# Patient Record
Sex: Female | Born: 1995 | Race: White | Hispanic: No | Marital: Single | Smoking: Former smoker
Health system: Southern US, Community
[De-identification: ages and names within clinical notes are randomized; demographics above are authoritative.]

## PROBLEM LIST (undated history)

## (undated) DIAGNOSIS — R109 Unspecified abdominal pain: Secondary | ICD-10-CM

## (undated) DIAGNOSIS — K5900 Constipation, unspecified: Secondary | ICD-10-CM

## (undated) DIAGNOSIS — R110 Nausea: Secondary | ICD-10-CM

## (undated) HISTORY — DX: Constipation, unspecified: K59.00

## (undated) HISTORY — DX: Nausea: R11.0

## (undated) HISTORY — DX: Unspecified abdominal pain: R10.9

## (undated) HISTORY — PX: TONSILLECTOMY: SUR1361

---
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
## 2014-01-14 ENCOUNTER — Telehealth: Payer: Self-pay | Admitting: Family Medicine

## 2014-01-14 NOTE — Telephone Encounter (Signed)
Appt given for tomorrow

## 2014-01-15 ENCOUNTER — Encounter: Payer: Self-pay | Admitting: Nurse Practitioner

## 2014-01-15 ENCOUNTER — Ambulatory Visit (INDEPENDENT_AMBULATORY_CARE_PROVIDER_SITE_OTHER): Payer: Medicaid Other | Admitting: Nurse Practitioner

## 2014-01-15 VITALS — BP 122/88 | HR 80 | Temp 98.7°F | Ht <= 58 in | Wt 90.0 lb

## 2014-01-15 DIAGNOSIS — N3000 Acute cystitis without hematuria: Secondary | ICD-10-CM

## 2014-01-15 DIAGNOSIS — N760 Acute vaginitis: Secondary | ICD-10-CM

## 2014-01-15 DIAGNOSIS — N898 Other specified noninflammatory disorders of vagina: Secondary | ICD-10-CM

## 2014-01-15 DIAGNOSIS — B9689 Other specified bacterial agents as the cause of diseases classified elsewhere: Secondary | ICD-10-CM

## 2014-01-15 DIAGNOSIS — Z3009 Encounter for other general counseling and advice on contraception: Secondary | ICD-10-CM

## 2014-01-15 DIAGNOSIS — A499 Bacterial infection, unspecified: Secondary | ICD-10-CM

## 2014-01-15 LAB — POCT URINALYSIS DIPSTICK
BILIRUBIN UA: NEGATIVE
Glucose, UA: NEGATIVE
Ketones, UA: NEGATIVE
NITRITE UA: NEGATIVE
PH UA: 7
Spec Grav, UA: 1.015
UROBILINOGEN UA: NEGATIVE

## 2014-01-15 LAB — POCT WET PREP (WET MOUNT)

## 2014-01-15 LAB — POCT UA - MICROSCOPIC ONLY
CASTS, UR, LPF, POC: NEGATIVE
CRYSTALS, UR, HPF, POC: NEGATIVE
MUCUS UA: NEGATIVE
YEAST UA: NEGATIVE

## 2014-01-15 MED ORDER — METRONIDAZOLE 500 MG PO TABS: 500.0000 mg | ORAL_TABLET | Freq: Two times a day (BID) | ORAL | Status: DC

## 2014-01-15 MED ORDER — NITROFURANTOIN MONOHYD MACRO 100 MG PO CAPS: 100.0000 mg | ORAL_CAPSULE | Freq: Two times a day (BID) | ORAL | Status: DC

## 2014-01-15 MED ORDER — LEVONORGESTREL-ETHINYL ESTRAD 0.1-20 MG-MCG PO TABS: 1.0000 | ORAL_TABLET | Freq: Every day | ORAL | Status: AC

## 2014-01-15 NOTE — Patient Instructions (Signed)
Safe Sex Safe sex is about reducing the risk of giving or getting a sexually transmitted disease (STD). STDs are spread through sexual contact involving the genitals, mouth, or rectum. Some STDs can be cured and others cannot. Safe sex can also prevent unintended pregnancies.  WHAT ARE SOME SAFE SEX PRACTICES?  Limit your sexual activity to only one partner who is only having sex with you.  Talk to your partner about his or her past partners, past STDs, and drug use.  Use a condom every time you have sexual intercourse. This includes vaginal, oral, and anal sexual activity. Both females and males should wear condoms during oral sex. Only use latex or polyurethane condoms and water-based lubricants. Using petroleum-based lubricants or oils to lubricate a condom will weaken the condom and increase the chance that it will break. The condom should be in place from the beginning to the end of sexual activity. Wearing a condom reduces, but does not completely eliminate, your risk of getting or giving an STD. STDs can be spread by contact with infected body fluids and skin.  Get vaccinated for hepatitis B and HPV.  Avoid alcohol and recreational drugs which can affect your judgement. You may forget to use a condom or participate in high-risk sex.  For females, avoid douching after sexual intercourse. Douching can spread an infection farther into the reproductive tract.  Check your body for signs of sores, blisters, rashes, or unusual discharge. See your health care provider if you notice any of these signs.  Avoid sexual contact if you have symptoms of an infection or are being treated for an STD. If you or your partner has herpes, avoid sexual contact when blisters are present. Use condoms at all other times.  If you are at risk of being infected with HIV, it is recommended that you take a prescription medicine daily to prevent HIV infection. This is called pre-exposure prophylaxis (PrEP). You are  considered at risk if:  You are a man who has sex with other men (MSM).  You are a heterosexual man or woman who is sexually active with more than one partner.  You take drugs by injection.  You are sexually active with a partner who has HIV.  Talk with your health care provider about whether you are at high risk of being infected with HIV. If you choose to begin PrEP, you should first be tested for HIV. You should then be tested every 3 months for as long as you are taking PrEP.  See your health care provider for regular screenings, exams, and tests for other STDs. Before having sex with a new partner, each of you should be screened for STDs and should talk about the results with each other. WHAT ARE THE BENEFITS OF SAFE SEX?   There is less chance of getting or giving an STD.  You can prevent unwanted or unintended pregnancies.  By discussing safe sex concerns with your partner, you may increase feelings of intimacy, comfort, trust, and honesty between the two of you. Document Released: 08/05/2004 Document Revised: 07/03/2013 Document Reviewed: 12/20/2011 Exit[employer] Patient Information 2015 [employer]. This information is not intended to replace advice given to you by your health care provider. Make sure you discuss any questions you have with your health care provider.  

## 2014-01-15 NOTE — Progress Notes (Signed)
Subjective:    Patient ID: Andrea Clayton, female    DOB: 1996-05-12, 18 y.o.   MRN: [id number]  HPI Patient in c/o painful intercourse- SHe just recently became sexually active again- the last several times it has been painful on her left side. SHe also says that she has a discharge that is white- denies any itching or dysuria. SLight pain when she wipes after voiding.    Review of Systems  Constitutional: Negative.   HENT: Negative.   Respiratory: Negative.   Cardiovascular: Negative.   Genitourinary: Positive for pelvic pain (only with intercourse.). Negative for dysuria, urgency and frequency.  Neurological: Negative.   Psychiatric/Behavioral: Negative.   All other systems reviewed and are negative.      Objective:   Physical Exam  Constitutional: She is oriented to person, place, and time. She appears well-developed and well-nourished.  Cardiovascular: Normal rate, regular rhythm and normal heart sounds.   Pulmonary/Chest: Effort normal and breath sounds normal.  Abdominal: Soft. Bowel sounds are normal. There is tenderness (left upper quadrant).  Neurological: She is alert and oriented to person, place, and time.  Skin: Skin is warm and dry.  Psychiatric: She has a normal mood and affect. Her behavior is normal. Judgment and thought content normal.   BP 122/88  Pulse 80  Temp(Src) 98.7 F (37.1 C) (Oral)  Ht 4\' 8"  (1.422 m)  Wt 90 lb (40.824 kg)  BMI 20.19 kg/m2  LMP 12/23/2013  Results for orders placed in visit on 01/15/14  POCT UA - MICROSCOPIC ONLY      Result Value Ref Range   WBC, Ur, HPF, POC 5-10     RBC, urine, microscopic 5-10     Bacteria, U Microscopic occ     Mucus, UA negative     Epithelial cells, urine per micros occ     Crystals, Ur, HPF, POC negative     Casts, Ur, LPF, POC negative     Yeast, UA negative    POCT URINALYSIS DIPSTICK      Result Value Ref Range   Color, UA gold     Clarity, UA slightly cloudy     Glucose, UA  negative     Bilirubin, UA negative     Ketones, UA negative     Spec Grav, UA 1.015     Blood, UA moderate     pH, UA 7.0     Protein, UA 4+     Urobilinogen, UA negative     Nitrite, UA negative     Leukocytes, UA large (3+)    POCT WET PREP (WET MOUNT)      Result Value Ref Range   Source Wet Prep POC vaginal     WBC, Wet Prep HPF POC 10-15     Bacteria Wet Prep HPF POC moderate     Clue Cells Wet Prep HPF POC Moderate     Yeast Wet Prep HPF POC None         Assessment & Plan:   1. Vaginal discharge   2. Bacterial vaginosis   3. Acute cystitis without hematuria   4. Birth control counseling    Meds ordered this encounter  Medications  . levonorgestrel-ethinyl estradiol (AVIANE) 0.1-20 MG-MCG tablet    Sig: Take 1 tablet by mouth daily.    Dispense:  1 Package    Refill:  11    Order Specific Question:  Supervising Provider    Answer:  Ernestina PennaMOORE, DONALD W [[id number]]  .  nitrofurantoin, macrocrystal-monohydrate, (MACROBID) 100 MG capsule    Sig: Take 1 capsule (100 mg total) by mouth 2 (two) times daily.    Dispense:  14 capsule    Refill:  0    Order Specific Question:  Supervising Provider    Answer:  Ernestina PennaMOORE, DONALD W [[id number]]  . metroNIDAZOLE (FLAGYL) 500 MG tablet    Sig: Take 1 tablet (500 mg total) by mouth 2 (two) times daily.    Dispense:  14 tablet    Refill:  0    Order Specific Question:  Supervising Provider    Answer:  Deborra MedinaMOORE, DONALD W [[id number]]   Safe sex Discussed how to take birth control Void after intercourse RTO if no better  Mary-Margaret Daphine DeutscherMartin, FNP

## 2014-01-18 ENCOUNTER — Emergency Department (HOSPITAL_COMMUNITY)
Admission: EM | Admit: 2014-01-18 | Discharge: 2014-01-18 | Discharge status: Against Medical Advice | Payer: Medicaid Other | Attending: Emergency Medicine | Admitting: Emergency Medicine

## 2014-01-18 ENCOUNTER — Encounter (HOSPITAL_COMMUNITY): Payer: Self-pay | Admitting: Emergency Medicine

## 2014-01-18 DIAGNOSIS — R109 Unspecified abdominal pain: Secondary | ICD-10-CM | POA: Diagnosis not present

## 2014-01-18 LAB — URINALYSIS, ROUTINE W REFLEX MICROSCOPIC
Bilirubin Urine: NEGATIVE
GLUCOSE, UA: NEGATIVE mg/dL
Hgb urine dipstick: NEGATIVE
KETONES UR: NEGATIVE mg/dL
NITRITE: NEGATIVE
PH: 7 (ref 5.0–8.0)
Protein, ur: NEGATIVE mg/dL
Specific Gravity, Urine: 1.015 (ref 1.005–1.030)
Urobilinogen, UA: 0.2 mg/dL (ref 0.0–1.0)

## 2014-01-18 LAB — URINE MICROSCOPIC-ADD ON

## 2014-01-18 LAB — PREGNANCY, URINE: PREG TEST UR: NEGATIVE

## 2014-01-18 NOTE — ED Notes (Addendum)
[facility] once again [facility].

## 2014-01-18 NOTE — ED Notes (Signed)
[facility].

## 2014-01-18 NOTE — ED Notes (Signed)
Pt has decided that she does not want to be seen due to the prolonged wait time. Pt and her friend was advised if she left she would still be billed and we were doing our very best to get her back.

## 2014-01-18 NOTE — ED Notes (Signed)
Pt. reports mid abdominal pain with nausea with nausea onset this evening , currently taking antibiotic for UTI .

## 2014-03-19 ENCOUNTER — Encounter: Payer: Self-pay | Admitting: Nurse Practitioner

## 2014-03-19 ENCOUNTER — Telehealth: Payer: Self-pay | Admitting: Nurse Practitioner

## 2014-03-19 ENCOUNTER — Ambulatory Visit (INDEPENDENT_AMBULATORY_CARE_PROVIDER_SITE_OTHER): Payer: Medicaid Other | Admitting: Nurse Practitioner

## 2014-03-19 VITALS — BP 117/80 | HR 118 | Temp 102.1°F | Ht <= 58 in | Wt 84.2 lb

## 2014-03-19 DIAGNOSIS — R5381 Other malaise: Secondary | ICD-10-CM

## 2014-03-19 DIAGNOSIS — R5383 Other fatigue: Secondary | ICD-10-CM

## 2014-03-19 DIAGNOSIS — Z00129 Encounter for routine child health examination without abnormal findings: Secondary | ICD-10-CM

## 2014-03-19 DIAGNOSIS — N3000 Acute cystitis without hematuria: Secondary | ICD-10-CM

## 2014-03-19 DIAGNOSIS — Z0000 Encounter for general adult medical examination without abnormal findings: Secondary | ICD-10-CM

## 2014-03-19 DIAGNOSIS — R634 Abnormal weight loss: Secondary | ICD-10-CM

## 2014-03-19 DIAGNOSIS — R509 Fever, unspecified: Secondary | ICD-10-CM

## 2014-03-19 DIAGNOSIS — N390 Urinary tract infection, site not specified: Secondary | ICD-10-CM

## 2014-03-19 LAB — POCT CBC
GRANULOCYTE PERCENT: 79.1 % (ref 37–80)
HEMATOCRIT: 42.1 % (ref 37.7–47.9)
HEMOGLOBIN: 13.1 g/dL (ref 12.2–16.2)
Lymph, poc: 1.3 (ref 0.6–3.4)
MCH: 27.5 pg (ref 27–31.2)
MCHC: 31.2 g/dL — AB (ref 31.8–35.4)
MCV: 88.4 fL (ref 80–97)
MPV: 7.8 fL (ref 0–99.8)
POC Granulocyte: 5.9 (ref 2–6.9)
POC LYMPH PERCENT: 17.9 %L (ref 10–50)
Platelet Count, POC: 220 10*3/uL (ref 142–424)
RBC: 4.8 M/uL (ref 4.04–5.48)
RDW, POC: 13 %
WBC: 7.5 10*3/uL (ref 4.6–10.2)

## 2014-03-19 LAB — POCT UA - MICROSCOPIC ONLY
CASTS, UR, LPF, POC: NEGATIVE
CRYSTALS, UR, HPF, POC: NEGATIVE
MUCUS UA: NEGATIVE
YEAST UA: NEGATIVE

## 2014-03-19 LAB — POCT URINALYSIS DIPSTICK
BILIRUBIN UA: NEGATIVE
Glucose, UA: NEGATIVE
KETONES UA: NEGATIVE
NITRITE UA: POSITIVE
PH UA: 6
Spec Grav, UA: 1.025
Urobilinogen, UA: NEGATIVE

## 2014-03-19 LAB — POCT URINE PREGNANCY: Preg Test, Ur: NEGATIVE

## 2014-03-19 MED ORDER — DOXYCYCLINE HYCLATE 100 MG PO TABS: 100.0000 mg | ORAL_TABLET | Freq: Two times a day (BID) | ORAL | Status: DC

## 2014-03-19 MED ORDER — NITROFURANTOIN MONOHYD MACRO 100 MG PO CAPS: 100.0000 mg | ORAL_CAPSULE | Freq: Two times a day (BID) | ORAL | Status: DC

## 2014-03-19 NOTE — Patient Instructions (Signed)

## 2014-03-19 NOTE — Progress Notes (Signed)
  Subjective:    Patient ID: Andrea Clayton, female    DOB: April 07, 1996, 18 y.o.   MRN: [id number]  HPI Patient in today for CPE- She has no medical problems and is on no meds- states that she is currently sexually active.  Currently having menstration ( very heavy) that is regular lasting 1 week.  Patient woke up with a sore throat, headache, and feels very tired. Temp 102.1 and patient says thart she feels fine.  States her breast are very tender for over a week. * headaches at 2-3 x a week- does not take any meds- denies caffeine over consumption * Hot at night with chills for over a week * fatigue  Review of Systems  Respiratory: Negative.   Cardiovascular: Negative.   Skin: Negative.   All other systems reviewed and are negative.      Objective:   Physical Exam  Constitutional: She is oriented to person, place, and time. She appears well-developed and well-nourished.  HENT:  Right Ear: External ear normal.  Left Ear: External ear normal.  Nose: Nose normal.  Mouth/Throat: Oropharynx is clear and moist.  Eyes: EOM are normal.  Neck: Trachea normal, normal range of motion and full passive range of motion without pain. Neck supple. No JVD present. Carotid bruit is not present. No thyromegaly present.  Cardiovascular: Normal rate, regular rhythm, normal heart sounds and intact distal pulses.  Exam reveals no gallop and no friction rub.   No murmur heard. Pulmonary/Chest: Effort normal and breath sounds normal.  Abdominal: Soft. Bowel sounds are normal. She exhibits no distension and no mass. There is no tenderness.  Genitourinary:  No pelvic exam today  Musculoskeletal: Normal range of motion.  Lymphadenopathy:    She has no cervical adenopathy.  Neurological: She is alert and oriented to person, place, and time. She has normal reflexes.  Skin: Skin is warm and dry.  Psychiatric: She has a normal mood and affect. Her behavior is normal. Judgment and thought content  normal.    BP 117/80  Pulse 118  Temp(Src) 102.1 F (38.9 C) (Oral)  Ht 4' 9.5" (1.461 m)  Wt 84 lb 3.2 oz (38.193 kg)  BMI 17.89 kg/m2  LMP 03/19/2014        Assessment & Plan:   1. Annual physical exam   2. Other malaise and fatigue   3. Fever, unspecified   4. Loss of weight   5. Acute cystitis without hematuria     Meds ordered this encounter  Medications  . DISCONTD: nitrofurantoin, macrocrystal-monohydrate, (MACROBID) 100 MG capsule    Sig: Take 1 capsule (100 mg total) by mouth 2 (two) times daily.    Dispense:  14 capsule    Refill:  0    Order Specific Question:  Supervising Provider    Answer:  Ernestina Penna [[id number]]  . doxycycline (VIBRA-TABS) 100 MG tablet    Sig: Take 1 tablet (100 mg total) by mouth 2 (two) times daily.    Dispense:  20 tablet    Refill:  0    Order Specific Question:  Supervising Provider    Answer:  Ernestina Penna [[id number]]     Force fluids  rest RTO prn  Mary-Margaret Daphine Deutscher, [place]

## 2014-03-19 NOTE — Addendum Note (Signed)
Addended by: Orma Render F on: 03/19/2014 01:35 PM   Modules accepted: Orders

## 2014-03-20 NOTE — Telephone Encounter (Signed)
Have patient come in for flu swab

## 2014-03-20 NOTE — Telephone Encounter (Signed)
Patient thinks she might have the flu because her fever spiked up to 103 last night

## 2014-03-20 NOTE — Telephone Encounter (Signed)
Yes we have flus in

## 2014-03-20 NOTE — Telephone Encounter (Signed)
Is she achy- check to see if we have flu swabs yet

## 2014-03-20 NOTE — Telephone Encounter (Signed)
lmtcb

## 2014-03-21 ENCOUNTER — Ambulatory Visit (INDEPENDENT_AMBULATORY_CARE_PROVIDER_SITE_OTHER): Payer: Medicaid Other | Admitting: Nurse Practitioner

## 2014-03-21 ENCOUNTER — Telehealth: Payer: Self-pay | Admitting: Nurse Practitioner

## 2014-03-21 ENCOUNTER — Encounter: Payer: Self-pay | Admitting: Nurse Practitioner

## 2014-03-21 VITALS — BP 112/88 | HR 82 | Temp 100.0°F | Ht <= 58 in | Wt 84.0 lb

## 2014-03-21 DIAGNOSIS — R509 Fever, unspecified: Secondary | ICD-10-CM

## 2014-03-21 LAB — BMP8+EGFR
BUN/Creatinine Ratio: 16 (ref 8–20)
BUN: 9 mg/dL (ref 6–20)
CALCIUM: 8.8 mg/dL (ref 8.7–10.2)
CHLORIDE: 98 mmol/L (ref 97–108)
CO2: 22 mmol/L (ref 18–29)
Creatinine, Ser: 0.56 mg/dL — ABNORMAL LOW (ref 0.57–1.00)
GFR calc non Af Amer: 136 mL/min/{1.73_m2} (ref >59)
GFR, EST AFRICAN AMERICAN: 156 mL/min/{1.73_m2} (ref >59)
GLUCOSE: 89 mg/dL (ref 65–99)
POTASSIUM: 4.5 mmol/L (ref 3.5–5.2)
Sodium: 137 mmol/L (ref 134–144)

## 2014-03-21 LAB — POCT INFLUENZA A/B
INFLUENZA A, POC: NEGATIVE
INFLUENZA B, POC: NEGATIVE

## 2014-03-21 LAB — LYME AB/WESTERN BLOT REFLEX
LYME DISEASE AB, QUANT, IGM: <0.8 index (ref 0.00–0.79)
Lyme IgG/IgM Ab: <0.91 {ISR} (ref 0.00–0.90)

## 2014-03-21 LAB — URINE CULTURE

## 2014-03-21 LAB — EPSTEIN-BARR VIRUS VCA ANTIBODY PANEL
EBV AB VCA, IGG: 106 U/mL — AB (ref 0.0–17.9)
EBV AB VCA, IGM: <36 U/mL (ref 0.0–35.9)
EBV NUCLEAR ANTIGEN AB, IGG: 194 U/mL — AB (ref 0.0–17.9)

## 2014-03-21 LAB — ROCKY MTN SPOTTED FVR ABS PNL(IGG+IGM)
RMSF IGM: 0.36 {index} (ref 0.00–0.89)
RMSF IgG: NEGATIVE

## 2014-03-21 NOTE — Telephone Encounter (Signed)
Left message for patient to come to office for flu swap.

## 2014-03-21 NOTE — Progress Notes (Signed)
   Subjective:    Patient ID: Andrea Clayton, female    DOB: 08/23/1995, 18 y.o.   MRN: [id number]  HPI Patient in not feeloing any better- SHe still has a fever of over 102 and feels terrible- She has not picked upp doxycycline rx that was sent in on 03/19/14.    Review of Systems  Constitutional: Positive for fever and appetite change (decreased).  HENT: Negative.   Eyes: Negative.   Respiratory: Negative.   Cardiovascular: Negative.   Genitourinary: Negative.   Neurological: Negative.   Psychiatric/Behavioral: Negative.   All other systems reviewed and are negative.      Objective:   Physical Exam  Constitutional: She is oriented to person, place, and time. She appears well-developed and well-nourished. She appears distressed.  HENT:  Right Ear: External ear normal.  Left Ear: External ear normal.  Nose: Nose normal.  Mouth/Throat: Oropharynx is clear and moist.  Eyes: Pupils are equal, round, and reactive to light.  Neck: Neck supple.  Cardiovascular: Normal rate, regular rhythm and normal heart sounds.   Pulmonary/Chest: Effort normal and breath sounds normal.  Abdominal: Soft.  Neurological: She is alert and oriented to person, place, and time.  Skin: Skin is warm and dry.  Psychiatric: She has a normal mood and affect. Her behavior is normal. Judgment and thought content normal.   BP 112/88  Pulse 82  Temp(Src) 100 F (37.8 C) (Oral)  Ht 4' 9.5" (1.461 m)  Wt 84 lb (38.102 kg)  BMI 17.85 kg/m2  LMP 03/19/2014       Assessment & Plan:   1. Fever, unspecified    Pick up doxy at pharmacy Force fluids  Rest RTO prn  Mary-Margaret Daphine Deutscher, FNP

## 2014-03-21 NOTE — Patient Instructions (Signed)

## 2014-03-21 NOTE — Telephone Encounter (Signed)
Pt aware only one script sent in to cover infection.  Macrobid was cancelled.

## 2014-03-25 ENCOUNTER — Encounter: Payer: Self-pay | Admitting: *Deleted

## 2014-03-26 ENCOUNTER — Telehealth: Payer: Self-pay | Admitting: Nurse Practitioner

## 2014-03-26 NOTE — Telephone Encounter (Signed)
Patient aware of results.

## 2014-11-26 ENCOUNTER — Telehealth: Payer: Self-pay | Admitting: Nurse Practitioner

## 2014-11-26 DIAGNOSIS — Z800 Family history of malignant neoplasm of digestive organs: Secondary | ICD-10-CM

## 2014-11-26 NOTE — Telephone Encounter (Signed)
Referral made 

## 2014-11-26 NOTE — Telephone Encounter (Signed)
Patient has a history of colon cancer. She said mother had colon cancer at the age of 228. She states that she is having abdominal pain, feels very tired and fatigued.

## 2014-11-26 NOTE — Telephone Encounter (Signed)
lmovm that referral has been made

## 2014-11-30 ENCOUNTER — Emergency Department (HOSPITAL_BASED_OUTPATIENT_CLINIC_OR_DEPARTMENT_OTHER)
Admission: EM | Admit: 2014-11-30 | Discharge: 2014-11-30 | Disposition: A | Discharge status: Home / Self Care | Payer: Medicaid Other | Attending: Emergency Medicine | Admitting: Emergency Medicine

## 2014-11-30 ENCOUNTER — Encounter (HOSPITAL_BASED_OUTPATIENT_CLINIC_OR_DEPARTMENT_OTHER): Payer: Self-pay

## 2014-11-30 ENCOUNTER — Emergency Department (HOSPITAL_BASED_OUTPATIENT_CLINIC_OR_DEPARTMENT_OTHER): Payer: Medicaid Other

## 2014-11-30 DIAGNOSIS — Z720 Tobacco use: Secondary | ICD-10-CM | POA: Diagnosis not present

## 2014-11-30 DIAGNOSIS — Z792 Long term (current) use of antibiotics: Secondary | ICD-10-CM | POA: Diagnosis not present

## 2014-11-30 DIAGNOSIS — R52 Pain, unspecified: Secondary | ICD-10-CM

## 2014-11-30 DIAGNOSIS — Z3202 Encounter for pregnancy test, result negative: Secondary | ICD-10-CM | POA: Diagnosis not present

## 2014-11-30 DIAGNOSIS — Z79899 Other long term (current) drug therapy: Secondary | ICD-10-CM | POA: Diagnosis not present

## 2014-11-30 DIAGNOSIS — Z8719 Personal history of other diseases of the digestive system: Secondary | ICD-10-CM | POA: Diagnosis not present

## 2014-11-30 DIAGNOSIS — R1032 Left lower quadrant pain: Secondary | ICD-10-CM | POA: Insufficient documentation

## 2014-11-30 DIAGNOSIS — R102 Pelvic and perineal pain: Secondary | ICD-10-CM | POA: Diagnosis present

## 2014-11-30 LAB — PREGNANCY, URINE: Preg Test, Ur: NEGATIVE

## 2014-11-30 LAB — URINE MICROSCOPIC-ADD ON

## 2014-11-30 LAB — URINALYSIS, ROUTINE W REFLEX MICROSCOPIC
Bilirubin Urine: NEGATIVE
Glucose, UA: NEGATIVE mg/dL
HGB URINE DIPSTICK: NEGATIVE
KETONES UR: NEGATIVE mg/dL
NITRITE: NEGATIVE
Protein, ur: NEGATIVE mg/dL
Specific Gravity, Urine: 1.021 (ref 1.005–1.030)
UROBILINOGEN UA: 1 mg/dL (ref 0.0–1.0)
pH: 6.5 (ref 5.0–8.0)

## 2014-11-30 LAB — WET PREP, GENITAL: TRICH WET PREP: NONE SEEN

## 2014-11-30 NOTE — ED Notes (Addendum)
Pt with left/suprapubic pelvic pain.  Reports whitish vaginal discharge.  Sexually active.  Tearful, states "i don't know if my ovarian cyst popped or what".  When asked if she has hx pt states no testing for same but mother informed her that she may have them b/c they run in family.  Denies n/v/d or dysuria. Of note, pt presents to ED with large cup of soda and subway sandwich in hand.

## 2014-11-30 NOTE — ED Provider Notes (Signed)
CSN: [id number]     Arrival date & time 11/30/14  1735 History  This chart was scribed for Arby Barrette, MD by Doreatha Martin, ED Scribe. This patient was seen in room MH06/MH06 and the patient's care was started at 6:30 PM.     Chief Complaint  Patient presents with  . Pelvic Pain   The history is provided by the patient. No language interpreter was used.   HPI Comments: Andrea Clayton is a 19 y.o. female sexually active with no PMHx of ovarian cysts who presents to the Emergency Department complaining of sudden onset, moderate, sharp abdominal pain localized to the LLQ and suprapubic area onset 20 min PTA. She states that the pain caused her to drop to the ground but has not taken any OTC medications for pain PTA. Pt states she has had similar episdoes of left sided abdominal pain before lasting 60-90 min and has been to multiple Gastroenterologists for the issue throughout her life with no specified Dx. She denies cough, congestion, nausea, rash, joint swelling, vomiting.Pt denies associated pain radiating to the lower back. Pt is allergic to Bc powder and Aspirin. She denies oral contraceptive use or being seen by an OB/GYN. She states her last sexual activity was 3 weeks ago. Pt reports vaginal discharge but states that this is consistent with baseline. Pt was recently seen with similar symptoms in [place] 2 months ago per her report.  Past Medical History  Diagnosis Date  . Abdominal pain   . Nausea   . Constipation    Past Surgical History  Procedure Laterality Date  . Tonsillectomy     Family History  Problem Relation Age of Onset  . Cholelithiasis Maternal Grandmother   . Celiac disease Neg Hx    History  Substance Use Topics  . Smoking status: Light Tobacco Smoker  . Smokeless tobacco: Not on file  . Alcohol Use: No   OB History    No data available     Review of Systems A complete 10 system review of systems was obtained and all systems are negative except  as noted in the HPI and PMH.     Allergies  Aspirin and Bc powder  Home Medications   Prior to Admission medications   Medication Sig Start Date End Date Taking? Authorizing Provider  doxycycline (VIBRA-TABS) 100 MG tablet Take 1 tablet (100 mg total) by mouth 2 (two) times daily. 03/19/14   Mary-Margaret Daphine Deutscher, FNP  levonorgestrel-ethinyl estradiol (AVIANE) 0.1-20 MG-MCG tablet Take 1 tablet by mouth daily. 01/15/14   Mary-Margaret Daphine Deutscher, FNP   Triage VS: BP 113/65 mmHg  Pulse 108  Temp(Src) 98.2 F (36.8 C) (Oral)  Resp 21  Ht  (1.422 m)  Wt 82 lb (37.195 kg)  BMI 18.39 kg/m2  SpO2 100%  LMP 11/10/2014 Physical Exam  Constitutional: She is oriented to person, place, and time. She appears well-developed and well-nourished.  HENT:  Head: Normocephalic and atraumatic.  Eyes: EOM are normal. Pupils are equal, round, and reactive to light.  Neck: Neck supple.  Cardiovascular: Normal rate, regular rhythm, normal heart sounds and intact distal pulses.   Pulmonary/Chest: Effort normal and breath sounds normal.  Abdominal: Soft. Bowel sounds are normal. She exhibits no distension. There is tenderness.  Moderate left lower quadrant tenderness to palpation. No guarding or rebound. Remainder of abdomen is soft and nontender without mass.  Genitourinary:  Normal external female genitalia. Speculum examination shows normal-appearing cervix without friability or discharge. Bimanual examination patient endorses  some tenderness in the left adnexa. There is no appreciable mass present.  Musculoskeletal: Normal range of motion. She exhibits no edema.  Neurological: She is alert and oriented to person, place, and time. She has normal strength. Coordination normal. GCS eye subscore is 4. GCS verbal subscore is 5. GCS motor subscore is 6.  Skin: Skin is warm, dry and intact.  Psychiatric: She has a normal mood and affect.    ED Course  Procedures  DIAGNOSTIC STUDIES: Oxygen Saturation is  100% on RA, normal by my interpretation.    COORDINATION OF CARE: 6:46 PM Discussed treatment plan with pt at bedside which includes Ibuprofen and pelvis US and pt agreed to plan.   Labs Review Labs Reviewed  URINALYSIS, ROUTINE W REFLEX MICROSCOPIC - Abnormal; Notable for the following:    APPearance CLOUDY (*)    Leukocytes, UA SMALL (*)    All other components within normal limits  URINE MICROSCOPIC-ADD ON - Abnormal; Notable for the following:    Squamous Epithelial / LPF MANY (*)    Bacteria, UA MANY (*)    All other components within normal limits  WET PREP, GENITAL  PREGNANCY, URINE  GC/CHLAMYDIA PROBE AMP ([place])    Imaging Review [place] Transvaginal Non-ob  11/30/2014   CLINICAL DATA:  Sudden left lower quadrant pain x2 hr  EXAM: TRANSABDOMINAL AND TRANSVAGINAL ULTRASOUND OF PELVIS  DOPPLER ULTRASOUND OF OVARIES  TECHNIQUE: Both transabdominal and transvaginal ultrasound examinations of the pelvis were performed. Transabdominal technique was performed for global imaging of the pelvis including uterus, ovaries, adnexal regions, and pelvic cul-de-sac.  It was necessary to proceed with endovaginal exam following the transabdominal exam to visualize the uterus and bilateral ovaries. Color and duplex Doppler ultrasound was utilized to evaluate blood flow to the ovaries.  COMPARISON:  None.  FINDINGS: Uterus  Measurements: 7.6 x 3.6 x 4.5 cm. No fibroids or other mass visualized.  Endometrium  Thickness: 8 mm.  No focal abnormality visualized.  Right ovary  Measurements: 3.6 x 2.1 x 2.1 cm. Normal appearance/no adnexal mass.  Left ovary  Measurements: 4.1 x 1.7 x 2.2 cm. Normal appearance/no adnexal mass.  Pulsed Doppler evaluation of both ovaries demonstrates normal low-resistance arterial and venous waveforms.  Other findings  Small volume pelvic ascites.  IMPRESSION: Negative pelvic ultrasound.  No evidence of ovarian torsion.   Electronically Signed   By: Charline BillsSriyesh  Krishnan M.D.   On:  11/30/2014 19:26   [place] Pelvis Complete  11/30/2014   CLINICAL DATA:  Sudden left lower quadrant pain x2 hr  EXAM: TRANSABDOMINAL AND TRANSVAGINAL ULTRASOUND OF PELVIS  DOPPLER ULTRASOUND OF OVARIES  TECHNIQUE: Both transabdominal and transvaginal ultrasound examinations of the pelvis were performed. Transabdominal technique was performed for global imaging of the pelvis including uterus, ovaries, adnexal regions, and pelvic cul-de-sac.  It was necessary to proceed with endovaginal exam following the transabdominal exam to visualize the uterus and bilateral ovaries. Color and duplex Doppler ultrasound was utilized to evaluate blood flow to the ovaries.  COMPARISON:  None.  FINDINGS: Uterus  Measurements: 7.6 x 3.6 x 4.5 cm. No fibroids or other mass visualized.  Endometrium  Thickness: 8 mm.  No focal abnormality visualized.  Right ovary  Measurements: 3.6 x 2.1 x 2.1 cm. Normal appearance/no adnexal mass.  Left ovary  Measurements: 4.1 x 1.7 x 2.2 cm. Normal appearance/no adnexal mass.  Pulsed Doppler evaluation of both ovaries demonstrates normal low-resistance arterial and venous waveforms.  Other findings  Small volume pelvic ascites.  IMPRESSION: Negative pelvic ultrasound.  No evidence of ovarian torsion.   Electronically Signed   By: Charline Bills M.D.   On: 11/30/2014 19:26   [place] Art/ven Flow Abd Pelv Doppler  11/30/2014   CLINICAL DATA:  Sudden left lower quadrant pain x2 hr  EXAM: TRANSABDOMINAL AND TRANSVAGINAL ULTRASOUND OF PELVIS  DOPPLER ULTRASOUND OF OVARIES  TECHNIQUE: Both transabdominal and transvaginal ultrasound examinations of the pelvis were performed. Transabdominal technique was performed for global imaging of the pelvis including uterus, ovaries, adnexal regions, and pelvic cul-de-sac.  It was necessary to proceed with endovaginal exam following the transabdominal exam to visualize the uterus and bilateral ovaries. Color and duplex Doppler ultrasound was utilized to evaluate blood  flow to the ovaries.  COMPARISON:  None.  FINDINGS: Uterus  Measurements: 7.6 x 3.6 x 4.5 cm. No fibroids or other mass visualized.  Endometrium  Thickness: 8 mm.  No focal abnormality visualized.  Right ovary  Measurements: 3.6 x 2.1 x 2.1 cm. Normal appearance/no adnexal mass.  Left ovary  Measurements: 4.1 x 1.7 x 2.2 cm. Normal appearance/no adnexal mass.  Pulsed Doppler evaluation of both ovaries demonstrates normal low-resistance arterial and venous waveforms.  Other findings  Small volume pelvic ascites.  IMPRESSION: Negative pelvic ultrasound.  No evidence of ovarian torsion.   Electronically Signed   By: Charline Bills M.D.   On: 11/30/2014 19:26     EKG Interpretation None     MDM   Final diagnoses:  Pelvic pain in female   Patient describes recurrent issues of left pelvic pain. At this time the pain is spontaneously abating. Is no clear etiology. Ultrasound does not show cyst or portion. Pregnancy test negative. Pelvic examination is not suggestive of PID. At this time the patient will be discharged to follow-up with gynecology. She does not endorse other incremental gastro enterologic symptoms.  Arby Barrette, MD 11/30/14 2106

## 2014-11-30 NOTE — Discharge Instructions (Signed)
Abdominal Pain, Women °Abdominal (stomach, pelvic, or belly) pain can be caused by many things. It is important to tell your doctor: °· The location of the pain. °· Does it come and go or is it present all the time? °· Are there things that start the pain (eating certain foods, exercise)? °· Are there other symptoms associated with the pain (fever, nausea, vomiting, diarrhea)? °All of this is helpful to know when trying to find the cause of the pain. °CAUSES  °· Stomach: virus or bacteria infection, or ulcer. °· Intestine: appendicitis (inflamed appendix), regional ileitis (Crohn's disease), ulcerative colitis (inflamed colon), irritable bowel syndrome, diverticulitis (inflamed diverticulum of the colon), or cancer of the stomach or intestine. °· Gallbladder disease or stones in the gallbladder. °· Kidney disease, kidney stones, or infection. °· Pancreas infection or cancer. °· Fibromyalgia (pain disorder). °· Diseases of the female organs: °¨ Uterus: fibroid (non-cancerous) tumors or infection. °¨ Fallopian tubes: infection or tubal pregnancy. °¨ Ovary: cysts or tumors. °¨ Pelvic adhesions (scar tissue). °¨ Endometriosis (uterus lining tissue growing in the pelvis and on the pelvic organs). °¨ Pelvic congestion syndrome (female organs filling up with blood just before the menstrual period). °¨ Pain with the menstrual period. °¨ Pain with ovulation (producing an egg). °¨ Pain with an IUD (intrauterine device, birth control) in the uterus. °¨ Cancer of the female organs. °· Functional pain (pain not caused by a disease, may improve without treatment). °· Psychological pain. °· Depression. °DIAGNOSIS  °Your doctor will decide the seriousness of your pain by doing an examination. °· Blood tests. °· X-rays. °· Ultrasound. °· CT scan (computed tomography, special type of X-ray). °· MRI (magnetic resonance imaging). °· Cultures, for infection. °· Barium enema (dye inserted in the large intestine, to better view it with  X-rays). °· Colonoscopy (looking in intestine with a lighted tube). °· Laparoscopy (minor surgery, looking in abdomen with a lighted tube). °· Major abdominal exploratory surgery (looking in abdomen with a large incision). °TREATMENT  °The treatment will depend on the cause of the pain.  °· Many cases can be observed and treated at home. °· Over-the-counter medicines recommended by your caregiver. °· Prescription medicine. °· Antibiotics, for infection. °· Birth control pills, for painful periods or for ovulation pain. °· Hormone treatment, for endometriosis. °· Nerve blocking injections. °· Physical therapy. °· Antidepressants. °· Counseling with a psychologist or psychiatrist. °· Minor or major surgery. °HOME CARE INSTRUCTIONS  °· Do not take laxatives, unless directed by your caregiver. °· Take over-the-counter pain medicine only if ordered by your caregiver. Do not take aspirin because it can cause an upset stomach or bleeding. °· Try a clear liquid diet (broth or water) as ordered by your caregiver. Slowly move to a bland diet, as tolerated, if the pain is related to the stomach or intestine. °· Have a thermometer and take your temperature several times a day, and record it. °· Bed rest and sleep, if it helps the pain. °· Avoid sexual intercourse, if it causes pain. °· Avoid stressful situations. °· Keep your follow-up appointments and tests, as your caregiver orders. °· If the pain does not go away with medicine or surgery, you may try: °¨ Acupuncture. °¨ Relaxation exercises (yoga, meditation). °¨ Group therapy. °¨ Counseling. °SEEK MEDICAL CARE IF:  °· You notice certain foods cause stomach pain. °· Your home care treatment is not helping your pain. °· You need stronger pain medicine. °· You want your IUD removed. °· You feel faint or   lightheaded. °· You develop nausea and vomiting. °· You develop a rash. °· You are having side effects or an allergy to your medicine. °SEEK IMMEDIATE MEDICAL CARE IF:  °· Your  pain does not go away or gets worse. °· You have a fever. °· Your pain is felt only in portions of the abdomen. The right side could possibly be appendicitis. The left lower portion of the abdomen could be colitis or diverticulitis. °· You are passing blood in your stools (bright red or black tarry stools, with or without vomiting). °· You have blood in your urine. °· You develop chills, with or without a fever. °· You pass out. °MAKE SURE YOU:  °· Understand these instructions. °· Will watch your condition. °· Will get help right away if you are not doing well or get worse. °Document Released: 04/25/2007 Document Revised: 11/12/2013 Document Reviewed: 05/15/2009 °ExitCare® Patient Information ©2015 [employer]. This information is not intended to replace advice given to you by your health care provider. Make sure you discuss any questions you have with your health care provider. ° °Emergency Department Resource Guide °1) Find a Doctor and Pay Out of Pocket °Although you won't have to find out who is covered by your insurance plan, it is a good idea to ask around and get recommendations. You will then need to call the office and see if the doctor you have chosen will accept you as a new patient and what types of options they offer for patients who are self-pay. Some doctors offer discounts or will set up payment plans for their patients who do not have insurance, but you will need to ask so you aren't surprised when you get to your appointment. ° °2) Contact Your Local Health Department °Not all health departments have doctors that can see patients for sick visits, but many do, so it is worth a call to see if yours does. If you don't know where your local health department is, you can check in your phone book. The CDC also has a tool to help you locate your state's health department, and many state websites also have listings of all of their local health departments. ° °3) Find a Walk-in Clinic °If your illness is  not likely to be very severe or complicated, you may want to try a walk in clinic. These are popping up all over the country in pharmacies, drugstores, and shopping centers. They're usually staffed by nurse practitioners or physician assistants that have been trained to treat common illnesses and complaints. They're usually fairly quick and inexpensive. However, if you have serious medical issues or chronic medical problems, these are probably not your best option. ° °No Primary Care Doctor: °- Call [facility] at  832-8000 - they can help you locate a primary care doctor that  accepts your insurance, provides certain services, etc. °- Physician Referral Service- 1-800-533-3463 ° °Chronic Pain Problems: °Organization         Address  Phone   Notes  ° [facility]  (336) 297-2271 Patients need to be referred by their primary care doctor.  ° °Medication Assistance: °Organization         Address  Phone   Notes  °[place] [facility] [address] °[place],  [address] (336) 641-8030 --Must be a resident of [place] °-- Must have NO insurance coverage whatsoever (no Medicaid/ Medicare, etc.) °-- The pt. MUST have a primary care doctor that directs their care regularly   and follows them in the community °  °[facility]  (866) 331-1348   °[employer]  (888) 892-1162   ° °Agencies that provide inexpensive medical care: °Organization         Address  Phone   Notes  °[facility]  (336) 832-8035   °[facility]    (336) 832-7272   °[facility] [address] °[place], [place] [address] (336) 832-4777   °[facility] [address], °[place] (336) 271-4999   °[facility]    (336) 373-0678   °[facility]    (336) 272-1050   °[facility] ° [address], [place] Phone:  (336) 832-4444, Fax:  (336) 832-4440 Hours of Operation:  9 am - 6  pm, M-F.  Also accepts Medicaid/Medicare and self-pay.  °[facility] ° [address], [place] Phone: (336) 832-3150, Fax: (336) [phone]. Hours of Operation:  8:30 am - 5:30 pm, M-F.  Also accepts Medicaid and self-pay.  °[facility] [place] [address], [place] Phone: (336) 878-6027   °[facility] [address], [place], [place] (336)723-1848, Ext. 123 Mondays & Thursdays: 7-9 AM.  First 15 patients are seen on a first come, first serve basis. °  ° °Medicaid-accepting [place] Providers: ° °Organization         Address  Phone   Notes  °[facility] [address], [place] (336) 641-2100 Also accepts self-pay patients.  °[facility] [address], [place] ° (336) 856-9996   °[facility] [address], [place] (336) 288-8857   °[facility] [address] [place] [address], [place] (336) 299-7000   °Veita Bland [address], [place]  ° (336) 373-1557 Only accepts [place] Access Medicaid patients after they have their name applied to their card.  ° °Self-Pay (no insurance) in [place]: ° °Organization         Address  Phone   Notes  °Sickle Cell Patients, [facility] [address], [place] (336) 832-1970   °[facility] [address], [place] (336) 832-4400   °[facility] ° [address], [place] (336) 992-4800   °[facility]/Dr. Osei-Bonsu ° [address] [place] [address], [place] or [address], [place] (336) [phone] Phone number for both [place] and [place] locations is the same.  °[facility] [address], [place] (336) 299-0000   °[facility] [address] [place] [address], [place] or [address] (336) 852-7530 °(336) 878-2260   °[facility] [address], [place] (336) 350-1642, phone; (336) 294-5005, fax Sees patients 1st and 3rd Saturday of every month.  Must not qualify for public or private insurance (i.e. Medicaid, Medicare, [place] Health Choice, Veterans' Benefits) • Household income should be no more than 200% of the poverty level •The clinic cannot treat you if you are pregnant or think you are pregnant • Sexually transmitted diseases are not treated at the clinic.  ° ° °Dental Care: °Organization         Address  Phone  Notes  °[facility] [address], [place] (336) 641-6152 Accepts children up to age 21 who   are enrolled in Medicaid or [place] Health Choice; pregnant women with a Medicaid card; and children who have applied for Medicaid or [place] Health Choice, but were declined, whose parents can pay a reduced fee at time of service.  °[facility] [place]  [address], [place] (336) 641-7733 Accepts children up to age 21 who are enrolled in Medicaid or [place] Health Choice; pregnant women with a Medicaid card; and children who have applied for Medicaid or [place] Health Choice, but were declined, whose parents can pay a reduced fee at time of service.  °[facility] ° [address], [place] (336) 641-4533 Patients are seen by appointment only. Walk-ins are not accepted. [facility] will see patients 18 years of age and older. °Monday - Tuesday (8am-5pm) °Most Wednesdays (8:30-5pm) °$30 per visit, cash only  °[facility] ° [address], [place] (336) 641-4533 Patients are seen by appointment only. Walk-ins are not accepted. [facility] will see patients 18 years of age and older. °One Wednesday Evening (Monthly: Volunteer Based).  $30 per visit, cash only  °[facility]  (919) 537-3737 for adults; Children under age 4, call Graduate Pediatric Dentistry at ([phone]  [phone]. Children aged 4-14, please call ([phone]) [phone] to request a pediatric application. ° Dental services are provided in all areas of dental care including fillings, crowns and bridges, complete and partial dentures, implants, gum treatment, root canals, and extractions. Preventive care is also provided. Treatment is provided to both adults and children. °Patients are selected via a lottery and there is often a waiting list. °  °[facility] [address], °[place] ° (336) 763-8833 [email or web address] °  °[facility] [address], [place], [place] (336)723-1848, Ext. 123 Second and Fourth Thursday of each month, opens at 6:30 AM; Clinic ends at 9 AM.  Patients are seen on a first-come first-served basis, and a limited number are seen during each clinic.  ° °[facility] ° [address], [place], [place] (336) 723-7904   Eligibility Requirements °You must have lived in [place], [place], or [place] counties for at least the last three months. °  You cannot be eligible for state or federal sponsored healthcare insurance, including Veterans Administration, Medicaid, or Medicare. °  You generally cannot be eligible for healthcare insurance through your employer.  °  How to apply: °Eligibility screenings are held every Tuesday and Wednesday afternoon from 1:00 pm until 4:00 pm. You do not need an appointment for the interview!  °[facility] [address], [place], [place] 336-631-2330   °[facility]  336-342-8273   °[facility]  336-703-3100   °[facility]  336-570-6415   ° °Behavioral Health Resources in the Community: °Intensive Outpatient Programs °Organization         Address  Phone  Notes  °[facility] [address], [place], [place] 336-878-6098   °[facility] [address], [place], [place] 336-832-9800   °[facility]  [address], [place], [place] ° 336-882-2125   °[facility] [address],  °[place],  1-800-853-5163 or 336-641-4981   °Substance Abuse Resources °Organization         Address  Phone  Notes  °[facility]  336-882-2125   °[facility]  336-784-9470   °  [facility]  336-285-9073   °[facility]  336-845-3988   °Residential & Outpatient Substance Abuse Program  1-800-659-3381   °Psychological Services °Organization         Address  Phone  Notes  °[facility]  336- 832-9600   °[facility]  336- 378-7881   °[facility] [address], [place] 1-800-853-5163 or 336-641-4981   ° °Mobile Crisis Teams °Organization         Address  Phone  Notes  °[facility], Mobile Crisis Care Unit  1-877-626-1772   °Assertive °[facility] ° [address] [place], [place] 336-834-9664   °Sharon DeEsch [address] °[place] 336-554-5454   ° °Self-Help/Support Groups °Organization         Address  Phone             Notes  °[facility] - variety of support groups  336- 373-1402 Call for more information  °Narcotics Anonymous (NA), [facility] [address], °[place]  2 meetings at this location  ° °Residential Treatment Programs °Organization         Address  Phone  Notes  °[facility] [address],    °[place]  1-866-801-8205   °[facility] ° [address], [place], [place] 704-293-8524   °[facility] [address], [place] 336-845-3988 Admissions: 8am-3pm M-F  °[facility] [address]    °[place], [place] 336-841-1104   °[facility] [address], [place], [place] 336-379-7146   °[facility] [address]  °[place], [place] 336-285-9073   °[facility] - Intensive Outpatient [address], [place], [place]  336-852-3033   °[facility] [address]  °[place], [place] 1-877-615-2722 or 336-784-9470   °[facility] [address] [place], [place] 336-227-7417 Accepts Medicaid  °[facility] [address]  ° [place] 1-800-659-3381 Substance Abuse/Addiction Treatment  ° °[place] Behavioral Health Resources °Organization         Address  Phone  Notes  °[facility]  (888) 581-9988   °Julie Brannon, PhD [address] [place], [place]   (336) 349-5553 or (336) 951-0000   °[facility]   [address] °[place], [place] (336) 349-4454   °[facility] [address], [place], [place] (336) 342-8316 Insurance/Medicaid/sponsorship through [facility]  °[facility] [address]                                    [place], [place] (336) 342-8316 Therapy/tele-psych/case  °[facility] [address]  ° [place], [place] (336) 349-2233    °Dr. Arfeen  (336) 349-4544   °[facility] [place]  [employer] [place] [facility] 1) [address], [place] °2) [address], [place] °3)  [address], [place] (336) 349-3220 °(336) 342-7768 ° °(336) 342-8140   °[place] [facility] (336) 342-1394 or (336) 342-3537 (After Hours)    ° ° °

## 2014-12-02 ENCOUNTER — Telehealth: Payer: Self-pay

## 2014-12-02 NOTE — Telephone Encounter (Signed)
Rec'd from [facility] forward 4 pages to GI Historical Provider

## 2014-12-03 LAB — GC/CHLAMYDIA PROBE AMP (~~LOC~~) NOT AT ARMC
Chlamydia: NEGATIVE
Neisseria Gonorrhea: NEGATIVE

## 2014-12-23 ENCOUNTER — Ambulatory Visit (HOSPITAL_COMMUNITY)
Admission: RE | Admit: 2014-12-23 | Discharge: 2014-12-23 | Disposition: A | Discharge status: Home / Self Care | Payer: Medicaid Other | Source: Ambulatory Visit | Attending: Adult Health Nurse Practitioner | Admitting: Adult Health Nurse Practitioner

## 2014-12-23 ENCOUNTER — Other Ambulatory Visit (HOSPITAL_COMMUNITY): Payer: Self-pay | Admitting: *Deleted

## 2014-12-23 ENCOUNTER — Ambulatory Visit (HOSPITAL_COMMUNITY): Payer: Medicaid Other

## 2014-12-23 ENCOUNTER — Other Ambulatory Visit (HOSPITAL_COMMUNITY): Payer: Self-pay | Admitting: Adult Health Nurse Practitioner

## 2014-12-23 DIAGNOSIS — K5900 Constipation, unspecified: Secondary | ICD-10-CM

## 2015-03-23 ENCOUNTER — Emergency Department (HOSPITAL_COMMUNITY)
Admission: EM | Admit: 2015-03-23 | Discharge: 2015-03-23 | Disposition: A | Discharge status: Home / Self Care | Payer: Medicaid Other | Attending: Emergency Medicine | Admitting: Emergency Medicine

## 2015-03-23 ENCOUNTER — Encounter (HOSPITAL_COMMUNITY): Payer: Self-pay | Admitting: *Deleted

## 2015-03-23 DIAGNOSIS — N3001 Acute cystitis with hematuria: Secondary | ICD-10-CM | POA: Insufficient documentation

## 2015-03-23 DIAGNOSIS — N898 Other specified noninflammatory disorders of vagina: Secondary | ICD-10-CM | POA: Diagnosis present

## 2015-03-23 DIAGNOSIS — Z8719 Personal history of other diseases of the digestive system: Secondary | ICD-10-CM | POA: Insufficient documentation

## 2015-03-23 DIAGNOSIS — B9689 Other specified bacterial agents as the cause of diseases classified elsewhere: Secondary | ICD-10-CM

## 2015-03-23 DIAGNOSIS — N760 Acute vaginitis: Secondary | ICD-10-CM | POA: Insufficient documentation

## 2015-03-23 DIAGNOSIS — R000 Tachycardia, unspecified: Secondary | ICD-10-CM | POA: Insufficient documentation

## 2015-03-23 DIAGNOSIS — Z793 Long term (current) use of hormonal contraceptives: Secondary | ICD-10-CM | POA: Diagnosis not present

## 2015-03-23 DIAGNOSIS — Z720 Tobacco use: Secondary | ICD-10-CM | POA: Insufficient documentation

## 2015-03-23 DIAGNOSIS — Z3202 Encounter for pregnancy test, result negative: Secondary | ICD-10-CM | POA: Insufficient documentation

## 2015-03-23 DIAGNOSIS — R102 Pelvic and perineal pain: Secondary | ICD-10-CM

## 2015-03-23 LAB — CBC
HCT: 35.8 % — ABNORMAL LOW (ref 36.0–46.0)
Hemoglobin: 12.3 g/dL (ref 12.0–15.0)
MCH: 31.5 pg (ref 26.0–34.0)
MCHC: 34.4 g/dL (ref 30.0–36.0)
MCV: 91.6 fL (ref 78.0–100.0)
PLATELETS: 243 10*3/uL (ref 150–400)
RBC: 3.91 MIL/uL (ref 3.87–5.11)
RDW: 12.6 % (ref 11.5–15.5)
WBC: 7 10*3/uL (ref 4.0–10.5)

## 2015-03-23 LAB — WET PREP, GENITAL
Trich, Wet Prep: NONE SEEN
Yeast Wet Prep HPF POC: NONE SEEN

## 2015-03-23 LAB — COMPREHENSIVE METABOLIC PANEL
ALT: 16 U/L (ref 14–54)
AST: 22 U/L (ref 15–41)
Albumin: 3.5 g/dL (ref 3.5–5.0)
Alkaline Phosphatase: 58 U/L (ref 38–126)
Anion gap: 11 (ref 5–15)
BUN: 9 mg/dL (ref 6–20)
CHLORIDE: 101 mmol/L (ref 101–111)
CO2: 27 mmol/L (ref 22–32)
Calcium: 8.9 mg/dL (ref 8.9–10.3)
Creatinine, Ser: 0.58 mg/dL (ref 0.44–1.00)
GFR calc Af Amer: >60 mL/min (ref >60)
Glucose, Bld: 101 mg/dL — ABNORMAL HIGH (ref 65–99)
Potassium: 3.4 mmol/L — ABNORMAL LOW (ref 3.5–5.1)
Sodium: 139 mmol/L (ref 135–145)
Total Bilirubin: 0.7 mg/dL (ref 0.3–1.2)
Total Protein: 7 g/dL (ref 6.5–8.1)

## 2015-03-23 LAB — URINALYSIS, ROUTINE W REFLEX MICROSCOPIC
GLUCOSE, UA: NEGATIVE mg/dL
Hgb urine dipstick: NEGATIVE
KETONES UR: NEGATIVE mg/dL
Nitrite: NEGATIVE
PH: 7.5 (ref 5.0–8.0)
Protein, ur: NEGATIVE mg/dL
Specific Gravity, Urine: 1.023 (ref 1.005–1.030)
Urobilinogen, UA: 4 mg/dL — ABNORMAL HIGH (ref 0.0–1.0)

## 2015-03-23 LAB — POC URINE PREG, ED: Preg Test, Ur: NEGATIVE

## 2015-03-23 LAB — URINE MICROSCOPIC-ADD ON

## 2015-03-23 LAB — LIPASE, BLOOD: LIPASE: 19 U/L — AB (ref 22–51)

## 2015-03-23 MED ORDER — AZITHROMYCIN 250 MG PO TABS
1000.0000 mg | ORAL_TABLET | Freq: Once | ORAL | Status: AC
  Administered 2015-03-23: 1000 mg via ORAL
  Filled 2015-03-23: qty 4

## 2015-03-23 MED ORDER — METRONIDAZOLE 500 MG PO TABS: 500.0000 mg | ORAL_TABLET | Freq: Two times a day (BID) | ORAL | Status: DC

## 2015-03-23 MED ORDER — PHENAZOPYRIDINE HCL 200 MG PO TABS: 200.0000 mg | ORAL_TABLET | Freq: Three times a day (TID) | ORAL | Status: AC

## 2015-03-23 MED ORDER — ONDANSETRON HCL 4 MG PO TABS: 4.0000 mg | ORAL_TABLET | Freq: Four times a day (QID) | ORAL | Status: AC

## 2015-03-23 MED ORDER — CEFTRIAXONE SODIUM 1 G IJ SOLR
1.0000 g | Freq: Once | INTRAMUSCULAR | Status: AC
  Administered 2015-03-23: 1 g via INTRAVENOUS
  Filled 2015-03-23: qty 10

## 2015-03-23 NOTE — ED Provider Notes (Signed)
CSN: [id number]     Arrival date & time 03/23/15  0029 History   First MD Initiated Contact with Patient 03/23/15 0103     Chief Complaint  Patient presents with  . Hematuria     (Consider location/radiation/quality/duration/timing/severity/associated sxs/prior Treatment) Patient is a 19 y.o. female presenting with vaginal discharge and hematuria.  Vaginal Discharge Quality:  White and yellow Severity:  Moderate Onset quality:  Gradual Duration:  1 week Timing:  Constant Progression:  Worsening Chronicity:  New Relieved by:  None tried Worsened by:  Nothing tried Ineffective treatments:  None tried Associated symptoms: abdominal pain, fever and urinary frequency   Hematuria This is a new problem. The current episode started in the past 7 days. The problem occurs constantly. The problem has been unchanged. Associated symptoms include abdominal pain, chills, a fever and myalgias. Nothing aggravates the symptoms. She has tried nothing for the symptoms.   Andrea Clayton is a 19 y.o. female who presents to the ED with vaginal discharge and blood in her urine. She reports that she first started with symptoms one week ago. She had fever 4 nights ago and has been taking medication for fever. No fever since last night. She reports pain in the suprapubic area associated with urination. LMP 03/27/2015, no birth control. G1P0, no hx of STI's.   Past Medical History  Diagnosis Date  . Abdominal pain   . Nausea   . Constipation    Past Surgical History  Procedure Laterality Date  . Tonsillectomy     Family History  Problem Relation Age of Onset  . Cholelithiasis Maternal Grandmother   . Celiac disease Neg Hx    Social History  Substance Use Topics  . Smoking status: Light Tobacco Smoker  . Smokeless tobacco: None  . Alcohol Use: No   OB History    No data available     Review of Systems  Constitutional: Positive for fever and chills.  Gastrointestinal: Positive for  abdominal pain.  Genitourinary: Positive for hematuria and vaginal discharge.  Musculoskeletal: Positive for myalgias.  all other systems negative    Allergies  Aspirin and Bc powder  Home Medications   Prior to Admission medications   Medication Sig Start Date End Date Taking? Authorizing Provider  levonorgestrel-ethinyl estradiol (AVIANE) 0.1-20 MG-MCG tablet Take 1 tablet by mouth daily. 01/15/14   Mary-Margaret Daphine Deutscher, FNP  metroNIDAZOLE (FLAGYL) 500 MG tablet Take 1 tablet (500 mg total) by mouth 2 (two) times daily. 03/23/15   Hope Orlene Och, NP  ondansetron (ZOFRAN) 4 MG tablet Take 1 tablet (4 mg total) by mouth every 6 (six) hours. 03/23/15   Hope Orlene Och, NP  phenazopyridine (PYRIDIUM) 200 MG tablet Take 1 tablet (200 mg total) by mouth 3 (three) times daily. 03/23/15   Hope Orlene Och, NP   BP 115/72 mmHg  Pulse 102  Temp(Src) 98.3 F (36.8 C) (Oral)  Resp 16  Ht  (1.448 m)  Wt 78 lb 5 oz (35.522 kg)  BMI 16.94 kg/m2  SpO2 100%  LMP 02/20/2015 Physical Exam  Constitutional: She is oriented to person, place, and time. She appears well-developed and well-nourished. No distress.  HENT:  Head: Normocephalic.  Right Ear: External ear normal.  Left Ear: External ear normal.  Eyes: Conjunctivae and EOM are normal.  Neck: Normal range of motion. Neck supple.  Cardiovascular: Tachycardia present.   Pulmonary/Chest: Effort normal.  Abdominal: Soft. Bowel sounds are normal. There is tenderness in the suprapubic area. There is  no rebound, no guarding and no CVA tenderness.  Genitourinary:  External genitalia without lesions, yellow d/c vaginal vault, Cervix inflamed, positive CMT, no adnexal tenderness or mass palpated. Uterus without enlargement.   Musculoskeletal: Normal range of motion.  Neurological: She is alert and oriented to person, place, and time. No cranial nerve deficit.  Skin: Skin is warm and dry.  Psychiatric: She has a normal mood and affect. Her behavior is  normal.  Nursing note and vitals reviewed.   ED Course  Procedures (including critical care time) Results for orders placed or performed during the hospital encounter of 03/23/15 (from the past 24 hour(s))  Lipase, blood     Status: Abnormal   Collection Time: 03/23/15  1:15 AM  Result Value Ref Range   Lipase 19 (L) 22 - 51 U/L  Comprehensive metabolic panel     Status: Abnormal   Collection Time: 03/23/15  1:15 AM  Result Value Ref Range   Sodium 139 135 - 145 mmol/L   Potassium 3.4 (L) 3.5 - 5.1 mmol/L   Chloride 101 101 - 111 mmol/L   CO2 27 22 - 32 mmol/L   Glucose, Bld 101 (H) 65 - 99 mg/dL   BUN 9 6 - 20 mg/dL   Creatinine, Ser 1.61 0.44 - 1.00 mg/dL   Calcium 8.9 8.9 - 09.6 mg/dL   Total Protein 7.0 6.5 - 8.1 g/dL   Albumin 3.5 3.5 - 5.0 g/dL   AST 22 15 - 41 U/L   ALT 16 14 - 54 U/L   Alkaline Phosphatase 58 38 - 126 U/L   Total Bilirubin 0.7 0.3 - 1.2 mg/dL   GFR calc non Af Amer >60 >60 mL/min   GFR calc Af Amer >60 >60 mL/min   Anion gap 11 5 - 15  CBC     Status: Abnormal   Collection Time: 03/23/15  1:15 AM  Result Value Ref Range   WBC 7.0 4.0 - 10.5 K/uL   RBC 3.91 3.87 - 5.11 MIL/uL   Hemoglobin 12.3 12.0 - 15.0 g/dL   HCT 04.5 (L) 40.9 - 81.1 %   MCV 91.6 78.0 - 100.0 fL   MCH 31.5 26.0 - 34.0 pg   MCHC 34.4 30.0 - 36.0 g/dL   RDW 91.4 78.2 - 95.6 %   Platelets 243 150 - 400 K/uL  Urinalysis, Routine w reflex microscopic (not at [facility])     Status: Abnormal   Collection Time: 03/23/15  1:19 AM  Result Value Ref Range   Color, Urine AMBER (A) YELLOW   APPearance CLOUDY (A) CLEAR   Specific Gravity, Urine 1.023 1.005 - 1.030   pH 7.5 5.0 - 8.0   Glucose, UA NEGATIVE NEGATIVE mg/dL   Hgb urine dipstick NEGATIVE NEGATIVE   Bilirubin Urine SMALL (A) NEGATIVE   Ketones, ur NEGATIVE NEGATIVE mg/dL   Protein, ur NEGATIVE NEGATIVE mg/dL   Urobilinogen, UA 4.0 (H) 0.0 - 1.0 mg/dL   Nitrite NEGATIVE NEGATIVE   Leukocytes, UA MODERATE (A) NEGATIVE  Urine  microscopic-add on     Status: Abnormal   Collection Time: 03/23/15  1:19 AM  Result Value Ref Range   Squamous Epithelial / LPF FEW (A) RARE   WBC, UA 11-20 <3 WBC/hpf   Bacteria, UA FEW (A) RARE  POC Urine Pregnancy, ED (do NOT order at [facility])     Status: None   Collection Time: 03/23/15  1:24 AM  Result Value Ref Range   Preg Test, Ur NEGATIVE NEGATIVE  Wet prep, genital     Status: Abnormal   Collection Time: 03/23/15  1:51 AM  Result Value Ref Range   Yeast Wet Prep HPF POC NONE SEEN NONE SEEN   Trich, Wet Prep NONE SEEN NONE SEEN   Clue Cells Wet Prep HPF POC MODERATE (A) NONE SEEN   WBC, Wet Prep HPF POC MANY (A) NONE SEEN     MDM  19 y.o. female with suprapubic pain and UTI symptoms. Also with vaginal d/c. Will treat for UTI and BV. She will return if symptoms worsen. Stable for d/c without acute abdomen, fever or signs of pyelo. Discussed with the patient clinical and lab findings and plan of care. All questioned fully answered.    Final diagnoses:  Acute cystitis with hematuria  Bacterial vaginosis  Pelvic pain in female       [place], NP 03/23/15 0222  Geoffery Lyons, MD 03/23/15 [phone]

## 2015-03-23 NOTE — ED Notes (Signed)
The pt has bloody urine and a vaginal discharge for one week with urinary frequency.  lmplast month

## 2015-03-25 LAB — URINE CULTURE: Culture: 60000

## 2015-03-25 LAB — GC/CHLAMYDIA PROBE AMP (~~LOC~~) NOT AT ARMC
Chlamydia: NEGATIVE
Neisseria Gonorrhea: NEGATIVE

## 2019-05-16 ENCOUNTER — Emergency Department (HOSPITAL_COMMUNITY)
Admission: EM | Admit: 2019-05-16 | Discharge: 2019-05-16 | Disposition: A | Discharge status: Home / Self Care | Payer: Self-pay | Attending: Emergency Medicine | Admitting: Emergency Medicine

## 2019-05-16 DIAGNOSIS — R1084 Generalized abdominal pain: Secondary | ICD-10-CM | POA: Insufficient documentation

## 2019-05-16 DIAGNOSIS — Z720 Tobacco use: Secondary | ICD-10-CM | POA: Diagnosis not present

## 2019-05-16 DIAGNOSIS — N939 Abnormal uterine and vaginal bleeding, unspecified: Secondary | ICD-10-CM | POA: Diagnosis present

## 2019-05-16 DIAGNOSIS — M545 Low back pain: Secondary | ICD-10-CM | POA: Diagnosis not present

## 2019-05-16 LAB — CBC
HCT: 40.3 % (ref 36.0–46.0)
Hemoglobin: 13.6 g/dL (ref 12.0–15.0)
MCH: 32.5 pg (ref 26.0–34.0)
MCHC: 33.7 g/dL (ref 30.0–36.0)
MCV: 96.2 fL (ref 80.0–100.0)
Platelets: 198 10*3/uL (ref 150–400)
RBC: 4.19 MIL/uL (ref 3.87–5.11)
RDW: 12.3 % (ref 11.5–15.5)
WBC: 6.5 10*3/uL (ref 4.0–10.5)
nRBC: 0 % (ref 0.0–0.2)

## 2019-05-16 LAB — COMPREHENSIVE METABOLIC PANEL
ALT: 14 U/L (ref 0–44)
AST: 19 U/L (ref 15–41)
Albumin: 4.1 g/dL (ref 3.5–5.0)
Alkaline Phosphatase: 43 U/L (ref 38–126)
Anion gap: 9 (ref 5–15)
BUN: 7 mg/dL (ref 6–20)
CO2: 26 mmol/L (ref 22–32)
Calcium: 9 mg/dL (ref 8.9–10.3)
Chloride: 104 mmol/L (ref 98–111)
Creatinine, Ser: 0.66 mg/dL (ref 0.44–1.00)
GFR calc Af Amer: >60 mL/min (ref >60)
GFR calc non Af Amer: >60 mL/min (ref >60)
Glucose, Bld: 126 mg/dL — ABNORMAL HIGH (ref 70–99)
Potassium: 3.7 mmol/L (ref 3.5–5.1)
Sodium: 139 mmol/L (ref 135–145)
Total Bilirubin: 0.4 mg/dL (ref 0.3–1.2)
Total Protein: 6.6 g/dL (ref 6.5–8.1)

## 2019-05-16 LAB — URINALYSIS, ROUTINE W REFLEX MICROSCOPIC
Bilirubin Urine: NEGATIVE
Glucose, UA: NEGATIVE mg/dL
Ketones, ur: NEGATIVE mg/dL
Leukocytes,Ua: NEGATIVE
Nitrite: NEGATIVE
Protein, ur: NEGATIVE mg/dL
Specific Gravity, Urine: 1.011 (ref 1.005–1.030)
pH: 6 (ref 5.0–8.0)

## 2019-05-16 LAB — I-STAT BETA HCG BLOOD, ED (MC, WL, AP ONLY): I-stat hCG, quantitative: <5 m[IU]/mL (ref <5)

## 2019-05-16 LAB — WET PREP, GENITAL
Sperm: NONE SEEN
Trich, Wet Prep: NONE SEEN
Yeast Wet Prep HPF POC: NONE SEEN

## 2019-05-16 LAB — LIPASE, BLOOD: Lipase: 28 U/L (ref 11–51)

## 2019-05-16 MED ORDER — SODIUM CHLORIDE 0.9% FLUSH: 3.0000 mL | Freq: Once | INTRAVENOUS | Status: DC

## 2019-05-16 NOTE — ED Notes (Signed)
Discharge instructions discussed with pt. Pt verbalized understanding. Pt stable and ambulatory. No signature pad available. 
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
## 2019-05-16 NOTE — Discharge Instructions (Addendum)
Please read the instructions below.  Please schedule an appointment with the Gynecologist. You will receive a call from the hospital if your test results come back positive. Avoid all sexual activity until you know your test results. If your results come back positive, it is important that you inform all of your sexual partners.  Return to the ER for significantly worsening bleeding, high fever, severe abdominal pain, or new or worsening symptoms.

## 2019-05-18 LAB — GC/CHLAMYDIA PROBE AMP (~~LOC~~) NOT AT ARMC
Chlamydia: NEGATIVE
Neisseria Gonorrhea: NEGATIVE

## 2020-01-03 ENCOUNTER — Encounter (HOSPITAL_COMMUNITY): Payer: Self-pay | Admitting: *Deleted

## 2020-01-03 ENCOUNTER — Emergency Department (HOSPITAL_COMMUNITY)
Admission: EM | Admit: 2020-01-03 | Discharge: 2020-01-03 | Disposition: A | Discharge status: Home / Self Care | Payer: Medicaid Other | Attending: Emergency Medicine | Admitting: Emergency Medicine

## 2020-01-03 ENCOUNTER — Emergency Department (HOSPITAL_COMMUNITY): Payer: Medicaid Other

## 2020-01-03 ENCOUNTER — Other Ambulatory Visit: Payer: Self-pay

## 2020-01-03 DIAGNOSIS — M545 Low back pain, unspecified: Secondary | ICD-10-CM

## 2020-01-03 DIAGNOSIS — N390 Urinary tract infection, site not specified: Secondary | ICD-10-CM | POA: Insufficient documentation

## 2020-01-03 DIAGNOSIS — Z87891 Personal history of nicotine dependence: Secondary | ICD-10-CM | POA: Insufficient documentation

## 2020-01-03 LAB — URINALYSIS, ROUTINE W REFLEX MICROSCOPIC
Bilirubin Urine: NEGATIVE
Glucose, UA: NEGATIVE mg/dL
Ketones, ur: NEGATIVE mg/dL
Nitrite: POSITIVE — AB
Protein, ur: NEGATIVE mg/dL
Specific Gravity, Urine: 1.019 (ref 1.005–1.030)
WBC, UA: >50 WBC/hpf — ABNORMAL HIGH (ref 0–5)
pH: 5 (ref 5.0–8.0)

## 2020-01-03 LAB — PREGNANCY, URINE: Preg Test, Ur: NEGATIVE

## 2020-01-03 MED ORDER — CEPHALEXIN 500 MG PO CAPS: 500.0000 mg | ORAL_CAPSULE | Freq: Two times a day (BID) | ORAL | 0 refills | Status: AC

## 2020-01-03 MED ORDER — CEPHALEXIN 500 MG PO CAPS
500.0000 mg | ORAL_CAPSULE | Freq: Once | ORAL | Status: AC
  Administered 2020-01-03: 500 mg via ORAL
  Filled 2020-01-03: qty 1
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
## 2020-01-03 NOTE — Discharge Instructions (Addendum)
Your testing today shows that you actually have a urinary tract infection  The bones in your back and spine show no signs of fractures or dislocations  Please take Tylenol as needed for your pain, because of your allergies we are unable to prescribe any anti-inflammatories.  I would continue to use warm compresses, follow-up with your family doctor as you may need some physical therapy.  It may also be that your urine infection is causing some of the back pain.  I have prescribed cephalexin twice a day for 7 days to treat this infection.  Please seek medical exam for severe or worsening symptoms
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
## 2020-02-21 ENCOUNTER — Other Ambulatory Visit: Payer: Self-pay | Admitting: Radiology

## 2020-02-21 ENCOUNTER — Other Ambulatory Visit: Payer: Medicaid Other

## 2020-02-21 DIAGNOSIS — Z20822 Contact with and (suspected) exposure to covid-19: Secondary | ICD-10-CM

## 2020-02-22 LAB — SARS-COV-2, NAA 2 DAY TAT

## 2020-02-22 LAB — NOVEL CORONAVIRUS, NAA: SARS-CoV-2, NAA: NOT DETECTED
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
